# Patient Record
Sex: Male | Born: 1955 | Race: White | Hispanic: No | Marital: Married | Smoking: Never smoker
Health system: Southern US, Community
[De-identification: ages and names within clinical notes are randomized; demographics above are authoritative.]

## PROBLEM LIST (undated history)

## (undated) DIAGNOSIS — IMO0002 Reserved for concepts with insufficient information to code with codable children: Secondary | ICD-10-CM

## (undated) DIAGNOSIS — E7800 Pure hypercholesterolemia, unspecified: Secondary | ICD-10-CM

## (undated) HISTORY — PX: OTHER SURGICAL HISTORY: SHX169

## (undated) HISTORY — PX: EYE SURGERY: SHX253

## (undated) HISTORY — PX: FACIAL LACERATION REPAIR: SHX6589

## (undated) HISTORY — PX: COLONOSCOPY W/ BIOPSIES AND POLYPECTOMY: SHX1376

---

## 2007-03-02 ENCOUNTER — Emergency Department (HOSPITAL_COMMUNITY): Admission: EM | Admit: 2007-03-02 | Discharge: 2007-03-02 | Payer: Self-pay | Admitting: Emergency Medicine

## 2015-07-13 DIAGNOSIS — S299XXA Unspecified injury of thorax, initial encounter: Secondary | ICD-10-CM | POA: Diagnosis not present

## 2015-07-13 DIAGNOSIS — S8991XA Unspecified injury of right lower leg, initial encounter: Secondary | ICD-10-CM | POA: Diagnosis not present

## 2015-07-13 DIAGNOSIS — S098XXA Other specified injuries of head, initial encounter: Secondary | ICD-10-CM | POA: Diagnosis not present

## 2015-07-13 DIAGNOSIS — S0231XA Fracture of orbital floor, right side, initial encounter for closed fracture: Secondary | ICD-10-CM | POA: Diagnosis not present

## 2015-07-13 DIAGNOSIS — S0181XA Laceration without foreign body of other part of head, initial encounter: Secondary | ICD-10-CM | POA: Diagnosis not present

## 2015-07-13 DIAGNOSIS — Z23 Encounter for immunization: Secondary | ICD-10-CM | POA: Diagnosis not present

## 2015-07-13 DIAGNOSIS — S61411A Laceration without foreign body of right hand, initial encounter: Secondary | ICD-10-CM | POA: Diagnosis not present

## 2015-07-13 DIAGNOSIS — R220 Localized swelling, mass and lump, head: Secondary | ICD-10-CM | POA: Diagnosis not present

## 2015-07-13 DIAGNOSIS — S66324A Laceration of extensor muscle, fascia and tendon of right ring finger at wrist and hand level, initial encounter: Secondary | ICD-10-CM | POA: Diagnosis not present

## 2015-07-13 DIAGNOSIS — S199XXA Unspecified injury of neck, initial encounter: Secondary | ICD-10-CM | POA: Diagnosis not present

## 2015-07-13 DIAGNOSIS — M50322 Other cervical disc degeneration at C5-C6 level: Secondary | ICD-10-CM | POA: Diagnosis not present

## 2015-07-13 DIAGNOSIS — S66323A Laceration of extensor muscle, fascia and tendon of left middle finger at wrist and hand level, initial encounter: Secondary | ICD-10-CM | POA: Diagnosis not present

## 2015-07-13 DIAGNOSIS — S61412A Laceration without foreign body of left hand, initial encounter: Secondary | ICD-10-CM | POA: Diagnosis not present

## 2015-07-13 DIAGNOSIS — J320 Chronic maxillary sinusitis: Secondary | ICD-10-CM | POA: Diagnosis not present

## 2015-07-13 DIAGNOSIS — S060X9S Concussion with loss of consciousness of unspecified duration, sequela: Secondary | ICD-10-CM | POA: Diagnosis not present

## 2015-07-13 DIAGNOSIS — S060X9A Concussion with loss of consciousness of unspecified duration, initial encounter: Secondary | ICD-10-CM | POA: Diagnosis not present

## 2015-07-13 DIAGNOSIS — S01411A Laceration without foreign body of right cheek and temporomandibular area, initial encounter: Secondary | ICD-10-CM | POA: Diagnosis not present

## 2015-07-13 DIAGNOSIS — S81011A Laceration without foreign body, right knee, initial encounter: Secondary | ICD-10-CM | POA: Diagnosis not present

## 2015-07-14 DIAGNOSIS — S0231XA Fracture of orbital floor, right side, initial encounter for closed fracture: Secondary | ICD-10-CM | POA: Diagnosis not present

## 2015-07-14 DIAGNOSIS — S01411A Laceration without foreign body of right cheek and temporomandibular area, initial encounter: Secondary | ICD-10-CM | POA: Diagnosis not present

## 2015-07-14 DIAGNOSIS — S0181XA Laceration without foreign body of other part of head, initial encounter: Secondary | ICD-10-CM | POA: Diagnosis not present

## 2015-07-14 DIAGNOSIS — S01111A Laceration without foreign body of right eyelid and periocular area, initial encounter: Secondary | ICD-10-CM | POA: Diagnosis not present

## 2015-07-16 DIAGNOSIS — M79642 Pain in left hand: Secondary | ICD-10-CM | POA: Diagnosis not present

## 2015-07-16 DIAGNOSIS — S81011A Laceration without foreign body, right knee, initial encounter: Secondary | ICD-10-CM | POA: Diagnosis not present

## 2015-07-16 DIAGNOSIS — M79641 Pain in right hand: Secondary | ICD-10-CM | POA: Diagnosis not present

## 2015-07-17 DIAGNOSIS — M79642 Pain in left hand: Secondary | ICD-10-CM | POA: Diagnosis not present

## 2015-07-17 DIAGNOSIS — M79641 Pain in right hand: Secondary | ICD-10-CM | POA: Diagnosis not present

## 2015-07-21 DIAGNOSIS — S0230XD Fracture of orbital floor, unspecified side, subsequent encounter for fracture with routine healing: Secondary | ICD-10-CM | POA: Diagnosis not present

## 2015-07-21 DIAGNOSIS — S0181XD Laceration without foreign body of other part of head, subsequent encounter: Secondary | ICD-10-CM | POA: Diagnosis not present

## 2015-07-23 ENCOUNTER — Other Ambulatory Visit: Payer: Self-pay | Admitting: Orthopedic Surgery

## 2015-07-23 DIAGNOSIS — S66392D Other injury of extensor muscle, fascia and tendon of right middle finger at wrist and hand level, subsequent encounter: Secondary | ICD-10-CM | POA: Diagnosis not present

## 2015-07-23 DIAGNOSIS — S81011D Laceration without foreign body, right knee, subsequent encounter: Secondary | ICD-10-CM | POA: Diagnosis not present

## 2015-07-23 DIAGNOSIS — M79642 Pain in left hand: Secondary | ICD-10-CM | POA: Diagnosis not present

## 2015-07-23 DIAGNOSIS — M79641 Pain in right hand: Secondary | ICD-10-CM | POA: Diagnosis not present

## 2015-07-25 ENCOUNTER — Encounter (HOSPITAL_COMMUNITY): Payer: Self-pay | Admitting: *Deleted

## 2015-07-25 NOTE — Progress Notes (Signed)
Pt denies SOB, chest pain, and being under the care of a cardiologist. Pt denies having a stress test, echo and cardiac cath. Pt denies having an EKG within the last year. Pt made aware to stop taking Aspirin, vitamins, fish oil and herbal medications. Do not take any NSAIDs ie: Ibuprofen, Advil, Naproxen, BC and Goody powder or any medication containing Aspirin.  Pt verbalized understanding of all pre-op instructions

## 2015-07-26 ENCOUNTER — Encounter (HOSPITAL_COMMUNITY): Payer: Self-pay | Admitting: *Deleted

## 2015-07-26 ENCOUNTER — Ambulatory Visit (HOSPITAL_COMMUNITY): Payer: BLUE CROSS/BLUE SHIELD | Admitting: Certified Registered"

## 2015-07-26 ENCOUNTER — Ambulatory Visit (HOSPITAL_COMMUNITY)
Admission: RE | Admit: 2015-07-26 | Discharge: 2015-07-26 | Disposition: A | Discharge status: Home / Self Care | Payer: BLUE CROSS/BLUE SHIELD | Source: Ambulatory Visit | Attending: Orthopedic Surgery | Admitting: Orthopedic Surgery

## 2015-07-26 ENCOUNTER — Encounter (HOSPITAL_COMMUNITY)
Admission: RE | Disposition: A | Discharge status: Home / Self Care | Payer: Self-pay | Source: Ambulatory Visit | Attending: Orthopedic Surgery

## 2015-07-26 DIAGNOSIS — S66811A Strain of other specified muscles, fascia and tendons at wrist and hand level, right hand, initial encounter: Secondary | ICD-10-CM | POA: Diagnosis not present

## 2015-07-26 DIAGNOSIS — S66309A Unspecified injury of extensor muscle, fascia and tendon of unspecified finger at wrist and hand level, initial encounter: Secondary | ICD-10-CM | POA: Diagnosis not present

## 2015-07-26 DIAGNOSIS — S66312A Strain of extensor muscle, fascia and tendon of right middle finger at wrist and hand level, initial encounter: Secondary | ICD-10-CM | POA: Diagnosis not present

## 2015-07-26 DIAGNOSIS — S66392A Other injury of extensor muscle, fascia and tendon of right middle finger at wrist and hand level, initial encounter: Secondary | ICD-10-CM | POA: Diagnosis not present

## 2015-07-26 DIAGNOSIS — S6981XA Other specified injuries of right wrist, hand and finger(s), initial encounter: Secondary | ICD-10-CM | POA: Insufficient documentation

## 2015-07-26 DIAGNOSIS — Y9355 Activity, bike riding: Secondary | ICD-10-CM | POA: Diagnosis not present

## 2015-07-26 DIAGNOSIS — S60412A Abrasion of right middle finger, initial encounter: Secondary | ICD-10-CM | POA: Diagnosis not present

## 2015-07-26 DIAGNOSIS — E7800 Pure hypercholesterolemia, unspecified: Secondary | ICD-10-CM | POA: Insufficient documentation

## 2015-07-26 DIAGNOSIS — S6991XA Unspecified injury of right wrist, hand and finger(s), initial encounter: Secondary | ICD-10-CM | POA: Diagnosis not present

## 2015-07-26 DIAGNOSIS — T148 Other injury of unspecified body region: Secondary | ICD-10-CM | POA: Diagnosis not present

## 2015-07-26 HISTORY — DX: Reserved for concepts with insufficient information to code with codable children: IMO0002

## 2015-07-26 HISTORY — DX: Pure hypercholesterolemia, unspecified: E78.00

## 2015-07-26 HISTORY — PX: I & D EXTREMITY: SHX5045

## 2015-07-26 SURGERY — IRRIGATION AND DEBRIDEMENT EXTREMITY
Anesthesia: General | Site: Hand | Laterality: Right

## 2015-07-26 MED ORDER — CEFAZOLIN SODIUM-DEXTROSE 2-4 GM/100ML-% IV SOLN
2.0000 g | INTRAVENOUS | Status: AC
  Administered 2015-07-26: 2 g via INTRAVENOUS

## 2015-07-26 MED ORDER — ONDANSETRON HCL 4 MG/2ML IJ SOLN
INTRAMUSCULAR | Status: DC | PRN
  Administered 2015-07-26: 4 mg via INTRAVENOUS

## 2015-07-26 MED ORDER — FENTANYL CITRATE (PF) 250 MCG/5ML IJ SOLN
INTRAMUSCULAR | Status: DC | PRN
  Administered 2015-07-26: 100 ug via INTRAVENOUS
  Administered 2015-07-26 (×2): 25 ug via INTRAVENOUS
  Administered 2015-07-26: 50 ug via INTRAVENOUS

## 2015-07-26 MED ORDER — BUPIVACAINE HCL (PF) 0.25 % IJ SOLN
INTRAMUSCULAR | Status: DC | PRN
  Administered 2015-07-26: 8 mL

## 2015-07-26 MED ORDER — HYDROMORPHONE HCL 1 MG/ML IJ SOLN
INTRAMUSCULAR | Status: AC
  Administered 2015-07-26: 0.5 mg via INTRAVENOUS
  Filled 2015-07-26: qty 1

## 2015-07-26 MED ORDER — LIDOCAINE HCL (CARDIAC) 20 MG/ML IV SOLN
INTRAVENOUS | Status: DC | PRN
  Administered 2015-07-26: 80 mg via INTRAVENOUS

## 2015-07-26 MED ORDER — BUPIVACAINE HCL (PF) 0.25 % IJ SOLN
INTRAMUSCULAR | Status: AC
  Filled 2015-07-26: qty 30

## 2015-07-26 MED ORDER — CEFAZOLIN SODIUM-DEXTROSE 2-4 GM/100ML-% IV SOLN
INTRAVENOUS | Status: AC
  Filled 2015-07-26: qty 100

## 2015-07-26 MED ORDER — SULFAMETHOXAZOLE-TRIMETHOPRIM 400-80 MG PO TABS: 1.0000 | ORAL_TABLET | Freq: Two times a day (BID) | ORAL | Status: AC

## 2015-07-26 MED ORDER — FENTANYL CITRATE (PF) 250 MCG/5ML IJ SOLN
INTRAMUSCULAR | Status: AC
  Filled 2015-07-26: qty 5

## 2015-07-26 MED ORDER — HYDROMORPHONE HCL 1 MG/ML IJ SOLN
0.2500 mg | INTRAMUSCULAR | Status: DC | PRN
  Administered 2015-07-26 (×2): 0.5 mg via INTRAVENOUS

## 2015-07-26 MED ORDER — LACTATED RINGERS IV SOLN
INTRAVENOUS | Status: DC
  Administered 2015-07-26 (×2): via INTRAVENOUS

## 2015-07-26 MED ORDER — PROPOFOL 10 MG/ML IV BOLUS
INTRAVENOUS | Status: AC
  Filled 2015-07-26: qty 20

## 2015-07-26 MED ORDER — SODIUM CHLORIDE 0.9 % IR SOLN
Status: DC | PRN
  Administered 2015-07-26: 1000 mL

## 2015-07-26 MED ORDER — MIDAZOLAM HCL 2 MG/2ML IJ SOLN
INTRAMUSCULAR | Status: AC
  Filled 2015-07-26: qty 2

## 2015-07-26 MED ORDER — BACITRACIN-NEOMYCIN-POLYMYXIN 400-5-5000 EX OINT
TOPICAL_OINTMENT | CUTANEOUS | Status: AC
Start: 2015-07-26 — End: 2015-07-26
  Filled 2015-07-26: qty 1

## 2015-07-26 MED ORDER — CHLORHEXIDINE GLUCONATE 4 % EX LIQD: 60.0000 mL | Freq: Once | CUTANEOUS | Status: DC

## 2015-07-26 MED ORDER — SODIUM CHLORIDE 0.9 % IR SOLN
Status: DC | PRN
  Administered 2015-07-26 (×2): 3000 mL

## 2015-07-26 MED ORDER — OXYCODONE HCL 5 MG PO TABS: 10.0000 mg | ORAL_TABLET | ORAL | Status: AC | PRN

## 2015-07-26 MED ORDER — ONDANSETRON HCL 4 MG/2ML IJ SOLN
INTRAMUSCULAR | Status: AC
  Filled 2015-07-26: qty 2

## 2015-07-26 MED ORDER — MIDAZOLAM HCL 5 MG/5ML IJ SOLN
INTRAMUSCULAR | Status: DC | PRN
  Administered 2015-07-26: 2 mg via INTRAVENOUS

## 2015-07-26 MED ORDER — PROPOFOL 10 MG/ML IV BOLUS
INTRAVENOUS | Status: DC | PRN
  Administered 2015-07-26: 160 mg via INTRAVENOUS

## 2015-07-26 SURGICAL SUPPLY — 54 items
BANDAGE ACE 3X5.8 VEL STRL LF (GAUZE/BANDAGES/DRESSINGS) ×1 IMPLANT
BANDAGE ELASTIC 4 VELCRO ST LF (GAUZE/BANDAGES/DRESSINGS) ×2 IMPLANT
BNDG CMPR 9X4 STRL LF SNTH (GAUZE/BANDAGES/DRESSINGS) ×1
BNDG CONFORM 2 STRL LF (GAUZE/BANDAGES/DRESSINGS) IMPLANT
BNDG ESMARK 4X9 LF (GAUZE/BANDAGES/DRESSINGS) ×1 IMPLANT
BNDG GAUZE ELAST 4 BULKY (GAUZE/BANDAGES/DRESSINGS) ×4 IMPLANT
CORDS BIPOLAR (ELECTRODE) ×2 IMPLANT
CUFF TOURNIQUET SINGLE 18IN (TOURNIQUET CUFF) ×2 IMPLANT
CUFF TOURNIQUET SINGLE 24IN (TOURNIQUET CUFF) IMPLANT
DRAPE SURG 17X23 STRL (DRAPES) ×1 IMPLANT
DRSG ADAPTIC 3X8 NADH LF (GAUZE/BANDAGES/DRESSINGS) ×1 IMPLANT
DRSG EMULSION OIL 3X3 NADH (GAUZE/BANDAGES/DRESSINGS) ×1 IMPLANT
DRSG MEPITEL 4X7.2 (GAUZE/BANDAGES/DRESSINGS) ×1 IMPLANT
GAUZE SPONGE 4X4 12PLY STRL (GAUZE/BANDAGES/DRESSINGS) ×2 IMPLANT
GAUZE XEROFORM 1X8 LF (GAUZE/BANDAGES/DRESSINGS) ×2 IMPLANT
GLOVE BIOGEL M 8.0 STRL (GLOVE) ×2 IMPLANT
GLOVE SS BIOGEL STRL SZ 8 (GLOVE) ×1 IMPLANT
GLOVE SUPERSENSE BIOGEL SZ 8 (GLOVE) ×1
GOWN STRL REUS W/ TWL LRG LVL3 (GOWN DISPOSABLE) ×1 IMPLANT
GOWN STRL REUS W/ TWL XL LVL3 (GOWN DISPOSABLE) ×2 IMPLANT
GOWN STRL REUS W/TWL LRG LVL3 (GOWN DISPOSABLE) ×2
GOWN STRL REUS W/TWL XL LVL3 (GOWN DISPOSABLE) ×4
HANDPIECE INTERPULSE COAX TIP (DISPOSABLE)
KIT BASIN OR (CUSTOM PROCEDURE TRAY) ×2 IMPLANT
KIT ROOM TURNOVER OR (KITS) ×2 IMPLANT
MANIFOLD NEPTUNE II (INSTRUMENTS) ×2 IMPLANT
NDL HYPO 25GX1X1/2 BEV (NEEDLE) IMPLANT
NEEDLE HYPO 25GX1X1/2 BEV (NEEDLE) ×2 IMPLANT
NS IRRIG 1000ML POUR BTL (IV SOLUTION) ×2 IMPLANT
PACK ORTHO EXTREMITY (CUSTOM PROCEDURE TRAY) ×2 IMPLANT
PAD ARMBOARD 7.5X6 YLW CONV (MISCELLANEOUS) ×3 IMPLANT
PAD CAST 4YDX4 CTTN HI CHSV (CAST SUPPLIES) ×1 IMPLANT
PADDING CAST ABS 3INX4YD NS (CAST SUPPLIES) ×1
PADDING CAST ABS 4INX4YD NS (CAST SUPPLIES) ×1
PADDING CAST ABS COTTON 3X4 (CAST SUPPLIES) IMPLANT
PADDING CAST ABS COTTON 4X4 ST (CAST SUPPLIES) IMPLANT
PADDING CAST COTTON 4X4 STRL (CAST SUPPLIES)
SCOTCHCAST PLUS 2X4 WHITE (CAST SUPPLIES) ×1 IMPLANT
SET CYSTO W/LG BORE CLAMP LF (SET/KITS/TRAYS/PACK) ×1 IMPLANT
SET HNDPC FAN SPRY TIP SCT (DISPOSABLE) IMPLANT
SPONGE LAP 4X18 X RAY DECT (DISPOSABLE) ×1 IMPLANT
SUT CHROMIC 5 0 P 3 (SUTURE) ×1 IMPLANT
SUT FIBERWIRE 3-0 18 TAPR NDL (SUTURE) ×6
SUT FIBERWIRE 4-0 18 TAPR NDL (SUTURE) ×2
SUT PROLENE 4 0 PS 2 18 (SUTURE) ×1 IMPLANT
SUTURE FIBERWR 3-0 18 TAPR NDL (SUTURE) IMPLANT
SUTURE FIBERWR 4-0 18 TAPR NDL (SUTURE) IMPLANT
SYR CONTROL 10ML LL (SYRINGE) ×1 IMPLANT
TOWEL OR 17X24 6PK STRL BLUE (TOWEL DISPOSABLE) ×2 IMPLANT
TOWEL OR 17X26 10 PK STRL BLUE (TOWEL DISPOSABLE) ×2 IMPLANT
TUBE ANAEROBIC SPECIMEN COL (MISCELLANEOUS) IMPLANT
TUBE CONNECTING 12X1/4 (SUCTIONS) ×2 IMPLANT
WATER STERILE IRR 1000ML POUR (IV SOLUTION) ×2 IMPLANT
YANKAUER SUCT BULB TIP NO VENT (SUCTIONS) ×2 IMPLANT

## 2015-07-26 NOTE — Transfer of Care (Signed)
Immediate Anesthesia Transfer of Care Note  Patient: Raymond Downs  Procedure(s) Performed: Procedure(s): IRRIGATION AND DEBRIDEMENT REPAIR  RIGHT MIDDLE FINGER EXTENSOR WITH GRAFT IF NECESSARY (Right)  Patient Location: PACU  Anesthesia Type:General  Level of Consciousness: lethargic and responds to stimulation  Airway & Oxygen Therapy: Patient Spontanous Breathing and Patient connected to nasal cannula oxygen  Post-op Assessment: Report given to RN  Post vital signs: Reviewed and stable  Last Vitals:  Filed Vitals:   07/26/15 0959  BP: 111/62  Pulse: 64  Temp: 37.3 C  Resp: 16    Complications: No apparent anesthesia complications

## 2015-07-26 NOTE — Op Note (Signed)
Please see dictation number [id number] Angelicia Lessner M.D.

## 2015-07-26 NOTE — Discharge Instructions (Signed)
Keep your cast on clean and dry.  We will remove the cast when we see you in follow-up.  Please continue the bandage changes for your knee as directed  Keep bandage clean and dry.  Call for any problems.  No smoking.  Criteria for driving a car: you should be off your pain medicine for 7-8 hours, able to drive one handed(confident), thinking clearly and feeling able in your judgement to drive. Continue elevation as it will decrease swelling.  If instructed by MD move your fingers within the confines of the bandage/splint.  Use ice if instructed by your MD. Call immediately for any sudden loss of feeling in your hand/arm or change in functional abilities of the extremity.We recommend that you to take vitamin C 1000 mg a day to promote healing. We also recommend that if you require  pain medicine that you take a stool softener to prevent constipation as most pain medicines will have constipation side effects. We recommend either Peri-Colace or Senokot and recommend that you also consider adding MiraLAX as well to prevent the constipation affects from pain medicine if you are required to use them. These medicines are over the counter and may be purchased at a local pharmacy. A cup of yogurt and a probiotic can also be helpful during the recovery process as the medicines can disrupt your intestinal environment.

## 2015-07-26 NOTE — Anesthesia Procedure Notes (Signed)
Procedure Name: LMA Insertion Date/Time: 07/26/2015 12:39 PM Performed by: Jefm MilesENNIE, Kale Rondeau E Pre-anesthesia Checklist: Patient identified, Emergency Drugs available, Suction available, Patient being monitored and Timeout performed Patient Re-evaluated:Patient Re-evaluated prior to inductionOxygen Delivery Method: Circle system utilized Preoxygenation: Pre-oxygenation with 100% oxygen Intubation Type: IV induction Ventilation: Mask ventilation without difficulty LMA: LMA inserted LMA Size: 4.0 Number of attempts: 2 Placement Confirmation: positive ETCO2 and breath sounds checked- equal and bilateral Tube secured with: Tape Dental Injury: Teeth and Oropharynx as per pre-operative assessment  Comments: 1st attempt with LMA 5, unable to ventilate, 2nd attempt with LMA 4 obtained good tidal volumes and ETCO2

## 2015-07-26 NOTE — Anesthesia Preprocedure Evaluation (Addendum)
Anesthesia Evaluation  Patient identified by MRN, date of birth, ID band Patient awake    Airway Mallampati: I  TM Distance: >3 FB Neck ROM: Full    Dental  (+) Teeth Intact   Pulmonary neg pulmonary ROS,    breath sounds clear to auscultation       Cardiovascular negative cardio ROS   Rhythm:Regular Rate:Normal     Neuro/Psych negative neurological ROS     GI/Hepatic negative GI ROS, Neg liver ROS,   Endo/Other  negative endocrine ROS  Renal/GU negative Renal ROS     Musculoskeletal negative musculoskeletal ROS (+)   Abdominal   Peds  Hematology   Anesthesia Other Findings   Reproductive/Obstetrics                            Anesthesia Physical Anesthesia Plan  ASA: I  Anesthesia Plan: General   Post-op Pain Management:    Induction: Intravenous  Airway Management Planned: LMA  Additional Equipment:   Intra-op Plan:   Post-operative Plan: Extubation in OR  Informed Consent: I have reviewed the patients History and Physical, chart, labs and discussed the procedure including the risks, benefits and alternatives for the proposed anesthesia with the patient or authorized representative who has indicated his/her understanding and acceptance.   Dental advisory given  Plan Discussed with: CRNA and Surgeon  Anesthesia Plan Comments:         Anesthesia Quick Evaluation
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
## 2015-07-26 NOTE — Anesthesia Postprocedure Evaluation (Signed)
Anesthesia Post Note  Patient: Raymond Downs  Procedure(s) Performed: Procedure(s) (LRB): IRRIGATION AND DEBRIDEMENT REPAIR  RIGHT MIDDLE FINGER EXTENSOR WITH GRAFT IF NECESSARY (Right)  Patient location during evaluation: PACU Anesthesia Type: General Level of consciousness: awake and alert Pain management: satisfactory to patient Vital Signs Assessment: post-procedure vital signs reviewed and stable Respiratory status: spontaneous breathing, nonlabored ventilation, respiratory function stable and patient connected to nasal cannula oxygen Cardiovascular status: blood pressure returned to baseline and stable Postop Assessment: no signs of nausea or vomiting Anesthetic complications: no    Last Vitals:  Filed Vitals:   07/26/15 1449 07/26/15 1504  BP: 122/76 118/78  Pulse: 64 68  Temp:    Resp: 7 10    Last Pain: There were no vitals filed for this visit.               Urban Naval,JAMES TERRILL

## 2015-07-26 NOTE — OR Nursing (Signed)
Right knee cleaned by Dr. Dominica SeverinWilliam Gramig with ch

## 2015-07-26 NOTE — OR Nursing (Signed)
Right knee cleaned with chlorhexidine and sterile 4x4 gauze sponges and triple antibiotic applied to area on knee then knee was wrapped with a sterile 6 inch ace bandage by Dr. Dominica SeverinWilliam Gramig.

## 2015-07-28 ENCOUNTER — Encounter (HOSPITAL_COMMUNITY): Payer: Self-pay | Admitting: Orthopedic Surgery

## 2015-07-30 DIAGNOSIS — S0231XA Fracture of orbital floor, right side, initial encounter for closed fracture: Secondary | ICD-10-CM | POA: Diagnosis not present

## 2015-07-30 DIAGNOSIS — H524 Presbyopia: Secondary | ICD-10-CM | POA: Diagnosis not present

## 2015-07-30 DIAGNOSIS — H5203 Hypermetropia, bilateral: Secondary | ICD-10-CM | POA: Diagnosis not present

## 2015-08-01 DIAGNOSIS — S66392D Other injury of extensor muscle, fascia and tendon of right middle finger at wrist and hand level, subsequent encounter: Secondary | ICD-10-CM | POA: Diagnosis not present

## 2015-08-01 DIAGNOSIS — Z4789 Encounter for other orthopedic aftercare: Secondary | ICD-10-CM | POA: Diagnosis not present

## 2015-08-04 DIAGNOSIS — S0181XD Laceration without foreign body of other part of head, subsequent encounter: Secondary | ICD-10-CM | POA: Diagnosis not present

## 2015-08-04 DIAGNOSIS — S0231XD Fracture of orbital floor, right side, subsequent encounter for fracture with routine healing: Secondary | ICD-10-CM | POA: Diagnosis not present

## 2015-08-11 DIAGNOSIS — S66392D Other injury of extensor muscle, fascia and tendon of right middle finger at wrist and hand level, subsequent encounter: Secondary | ICD-10-CM | POA: Diagnosis not present

## 2015-08-11 DIAGNOSIS — M79641 Pain in right hand: Secondary | ICD-10-CM | POA: Diagnosis not present

## 2015-08-15 DIAGNOSIS — S66392D Other injury of extensor muscle, fascia and tendon of right middle finger at wrist and hand level, subsequent encounter: Secondary | ICD-10-CM | POA: Diagnosis not present

## 2015-08-18 DIAGNOSIS — S66392D Other injury of extensor muscle, fascia and tendon of right middle finger at wrist and hand level, subsequent encounter: Secondary | ICD-10-CM | POA: Diagnosis not present

## 2015-08-20 DIAGNOSIS — S66392D Other injury of extensor muscle, fascia and tendon of right middle finger at wrist and hand level, subsequent encounter: Secondary | ICD-10-CM | POA: Diagnosis not present

## 2015-08-26 DIAGNOSIS — S66392D Other injury of extensor muscle, fascia and tendon of right middle finger at wrist and hand level, subsequent encounter: Secondary | ICD-10-CM | POA: Diagnosis not present

## 2015-08-28 DIAGNOSIS — S66392D Other injury of extensor muscle, fascia and tendon of right middle finger at wrist and hand level, subsequent encounter: Secondary | ICD-10-CM | POA: Diagnosis not present

## 2015-09-03 DIAGNOSIS — S66392D Other injury of extensor muscle, fascia and tendon of right middle finger at wrist and hand level, subsequent encounter: Secondary | ICD-10-CM | POA: Diagnosis not present

## 2015-09-09 DIAGNOSIS — S66392D Other injury of extensor muscle, fascia and tendon of right middle finger at wrist and hand level, subsequent encounter: Secondary | ICD-10-CM | POA: Diagnosis not present

## 2015-09-16 DIAGNOSIS — S66392D Other injury of extensor muscle, fascia and tendon of right middle finger at wrist and hand level, subsequent encounter: Secondary | ICD-10-CM | POA: Diagnosis not present

## 2015-09-18 DIAGNOSIS — S66392D Other injury of extensor muscle, fascia and tendon of right middle finger at wrist and hand level, subsequent encounter: Secondary | ICD-10-CM | POA: Diagnosis not present

## 2015-09-22 DIAGNOSIS — S66392D Other injury of extensor muscle, fascia and tendon of right middle finger at wrist and hand level, subsequent encounter: Secondary | ICD-10-CM | POA: Diagnosis not present

## 2015-09-24 DIAGNOSIS — S66392D Other injury of extensor muscle, fascia and tendon of right middle finger at wrist and hand level, subsequent encounter: Secondary | ICD-10-CM | POA: Diagnosis not present

## 2015-09-29 DIAGNOSIS — S0231XD Fracture of orbital floor, right side, subsequent encounter for fracture with routine healing: Secondary | ICD-10-CM | POA: Diagnosis not present

## 2015-09-29 DIAGNOSIS — S0181XD Laceration without foreign body of other part of head, subsequent encounter: Secondary | ICD-10-CM | POA: Diagnosis not present

## 2015-10-08 DIAGNOSIS — S66392D Other injury of extensor muscle, fascia and tendon of right middle finger at wrist and hand level, subsequent encounter: Secondary | ICD-10-CM | POA: Diagnosis not present

## 2015-10-13 DIAGNOSIS — S66392D Other injury of extensor muscle, fascia and tendon of right middle finger at wrist and hand level, subsequent encounter: Secondary | ICD-10-CM | POA: Diagnosis not present

## 2015-10-13 DIAGNOSIS — M25641 Stiffness of right hand, not elsewhere classified: Secondary | ICD-10-CM | POA: Diagnosis not present

## 2015-10-23 DIAGNOSIS — S66392D Other injury of extensor muscle, fascia and tendon of right middle finger at wrist and hand level, subsequent encounter: Secondary | ICD-10-CM | POA: Diagnosis not present

## 2015-10-23 DIAGNOSIS — M25641 Stiffness of right hand, not elsewhere classified: Secondary | ICD-10-CM | POA: Diagnosis not present

## 2015-11-05 DIAGNOSIS — Z4789 Encounter for other orthopedic aftercare: Secondary | ICD-10-CM | POA: Diagnosis not present

## 2015-12-22 DIAGNOSIS — L814 Other melanin hyperpigmentation: Secondary | ICD-10-CM | POA: Diagnosis not present

## 2015-12-22 DIAGNOSIS — D1801 Hemangioma of skin and subcutaneous tissue: Secondary | ICD-10-CM | POA: Diagnosis not present

## 2015-12-22 DIAGNOSIS — L821 Other seborrheic keratosis: Secondary | ICD-10-CM | POA: Diagnosis not present

## 2015-12-22 DIAGNOSIS — D225 Melanocytic nevi of trunk: Secondary | ICD-10-CM | POA: Diagnosis not present

## 2016-06-17 DIAGNOSIS — H2513 Age-related nuclear cataract, bilateral: Secondary | ICD-10-CM | POA: Diagnosis not present

## 2016-06-17 DIAGNOSIS — H5203 Hypermetropia, bilateral: Secondary | ICD-10-CM | POA: Diagnosis not present

## 2016-06-24 DIAGNOSIS — Z125 Encounter for screening for malignant neoplasm of prostate: Secondary | ICD-10-CM | POA: Diagnosis not present

## 2016-06-24 DIAGNOSIS — Z0000 Encounter for general adult medical examination without abnormal findings: Secondary | ICD-10-CM | POA: Diagnosis not present

## 2016-06-24 DIAGNOSIS — E7800 Pure hypercholesterolemia, unspecified: Secondary | ICD-10-CM | POA: Diagnosis not present

## 2016-08-17 DIAGNOSIS — L720 Epidermal cyst: Secondary | ICD-10-CM | POA: Diagnosis not present

## 2016-08-17 DIAGNOSIS — L820 Inflamed seborrheic keratosis: Secondary | ICD-10-CM | POA: Diagnosis not present

## 2016-08-17 DIAGNOSIS — L821 Other seborrheic keratosis: Secondary | ICD-10-CM | POA: Diagnosis not present

## 2016-08-17 DIAGNOSIS — D225 Melanocytic nevi of trunk: Secondary | ICD-10-CM | POA: Diagnosis not present

## 2017-02-02 DIAGNOSIS — Z23 Encounter for immunization: Secondary | ICD-10-CM | POA: Diagnosis not present

## 2017-04-20 DIAGNOSIS — L814 Other melanin hyperpigmentation: Secondary | ICD-10-CM | POA: Diagnosis not present

## 2017-04-20 DIAGNOSIS — D1801 Hemangioma of skin and subcutaneous tissue: Secondary | ICD-10-CM | POA: Diagnosis not present

## 2017-04-20 DIAGNOSIS — D225 Melanocytic nevi of trunk: Secondary | ICD-10-CM | POA: Diagnosis not present

## 2017-04-20 DIAGNOSIS — L821 Other seborrheic keratosis: Secondary | ICD-10-CM | POA: Diagnosis not present

## 2017-09-28 DIAGNOSIS — K5730 Diverticulosis of large intestine without perforation or abscess without bleeding: Secondary | ICD-10-CM | POA: Diagnosis not present

## 2017-09-28 DIAGNOSIS — Z86010 Personal history of colonic polyps: Secondary | ICD-10-CM | POA: Diagnosis not present

## 2017-12-13 DIAGNOSIS — Z23 Encounter for immunization: Secondary | ICD-10-CM | POA: Diagnosis not present

## 2018-01-16 DIAGNOSIS — Z125 Encounter for screening for malignant neoplasm of prostate: Secondary | ICD-10-CM | POA: Diagnosis not present

## 2018-01-16 DIAGNOSIS — Z0000 Encounter for general adult medical examination without abnormal findings: Secondary | ICD-10-CM | POA: Diagnosis not present

## 2018-01-16 DIAGNOSIS — E7800 Pure hypercholesterolemia, unspecified: Secondary | ICD-10-CM | POA: Diagnosis not present

## 2018-01-16 DIAGNOSIS — Z23 Encounter for immunization: Secondary | ICD-10-CM | POA: Diagnosis not present

## 2018-12-04 DIAGNOSIS — H5010 Unspecified exotropia: Secondary | ICD-10-CM | POA: Diagnosis not present

## 2018-12-04 DIAGNOSIS — H2513 Age-related nuclear cataract, bilateral: Secondary | ICD-10-CM | POA: Diagnosis not present

## 2018-12-04 DIAGNOSIS — H524 Presbyopia: Secondary | ICD-10-CM | POA: Diagnosis not present

## 2019-01-02 DIAGNOSIS — Z23 Encounter for immunization: Secondary | ICD-10-CM | POA: Diagnosis not present

## 2019-01-31 DIAGNOSIS — Z125 Encounter for screening for malignant neoplasm of prostate: Secondary | ICD-10-CM | POA: Diagnosis not present

## 2019-01-31 DIAGNOSIS — Z0000 Encounter for general adult medical examination without abnormal findings: Secondary | ICD-10-CM | POA: Diagnosis not present

## 2019-01-31 DIAGNOSIS — E7800 Pure hypercholesterolemia, unspecified: Secondary | ICD-10-CM | POA: Diagnosis not present

## 2019-11-02 DIAGNOSIS — B349 Viral infection, unspecified: Secondary | ICD-10-CM | POA: Diagnosis not present

## 2019-11-02 DIAGNOSIS — Z6826 Body mass index (BMI) 26.0-26.9, adult: Secondary | ICD-10-CM | POA: Diagnosis not present

## 2019-11-02 DIAGNOSIS — Z20822 Contact with and (suspected) exposure to covid-19: Secondary | ICD-10-CM | POA: Diagnosis not present

## 2019-11-02 DIAGNOSIS — J029 Acute pharyngitis, unspecified: Secondary | ICD-10-CM | POA: Diagnosis not present

## 2019-11-03 DIAGNOSIS — Z20822 Contact with and (suspected) exposure to covid-19: Secondary | ICD-10-CM | POA: Diagnosis not present

## 2020-01-31 DIAGNOSIS — Z23 Encounter for immunization: Secondary | ICD-10-CM | POA: Diagnosis not present

## 2020-02-07 DIAGNOSIS — Z8619 Personal history of other infectious and parasitic diseases: Secondary | ICD-10-CM | POA: Diagnosis not present

## 2020-02-07 DIAGNOSIS — Z125 Encounter for screening for malignant neoplasm of prostate: Secondary | ICD-10-CM | POA: Diagnosis not present

## 2020-02-07 DIAGNOSIS — Z0000 Encounter for general adult medical examination without abnormal findings: Secondary | ICD-10-CM | POA: Diagnosis not present

## 2020-02-07 DIAGNOSIS — Z86010 Personal history of colonic polyps: Secondary | ICD-10-CM | POA: Diagnosis not present

## 2020-02-07 DIAGNOSIS — E7800 Pure hypercholesterolemia, unspecified: Secondary | ICD-10-CM | POA: Diagnosis not present

## 2021-01-12 DIAGNOSIS — Z23 Encounter for immunization: Secondary | ICD-10-CM | POA: Diagnosis not present

## 2021-01-27 DIAGNOSIS — H5203 Hypermetropia, bilateral: Secondary | ICD-10-CM | POA: Diagnosis not present

## 2021-01-27 DIAGNOSIS — H2513 Age-related nuclear cataract, bilateral: Secondary | ICD-10-CM | POA: Diagnosis not present

## 2021-03-11 DIAGNOSIS — Z23 Encounter for immunization: Secondary | ICD-10-CM | POA: Diagnosis not present

## 2021-03-11 DIAGNOSIS — E7800 Pure hypercholesterolemia, unspecified: Secondary | ICD-10-CM | POA: Diagnosis not present

## 2021-03-11 DIAGNOSIS — R6882 Decreased libido: Secondary | ICD-10-CM | POA: Diagnosis not present

## 2021-03-11 DIAGNOSIS — Z0000 Encounter for general adult medical examination without abnormal findings: Secondary | ICD-10-CM | POA: Diagnosis not present

## 2021-03-11 DIAGNOSIS — Z125 Encounter for screening for malignant neoplasm of prostate: Secondary | ICD-10-CM | POA: Diagnosis not present

## 2021-03-19 DIAGNOSIS — H1132 Conjunctival hemorrhage, left eye: Secondary | ICD-10-CM | POA: Diagnosis not present

## 2021-05-27 DIAGNOSIS — D492 Neoplasm of unspecified behavior of bone, soft tissue, and skin: Secondary | ICD-10-CM | POA: Diagnosis not present

## 2021-05-27 DIAGNOSIS — L821 Other seborrheic keratosis: Secondary | ICD-10-CM | POA: Diagnosis not present

## 2021-05-27 DIAGNOSIS — D225 Melanocytic nevi of trunk: Secondary | ICD-10-CM | POA: Diagnosis not present

## 2021-05-27 DIAGNOSIS — L819 Disorder of pigmentation, unspecified: Secondary | ICD-10-CM | POA: Diagnosis not present

## 2021-05-27 DIAGNOSIS — L570 Actinic keratosis: Secondary | ICD-10-CM | POA: Diagnosis not present

## 2021-05-27 DIAGNOSIS — L814 Other melanin hyperpigmentation: Secondary | ICD-10-CM | POA: Diagnosis not present

## 2021-06-30 DIAGNOSIS — M25562 Pain in left knee: Secondary | ICD-10-CM | POA: Diagnosis not present

## 2021-07-01 ENCOUNTER — Ambulatory Visit
Admission: RE | Admit: 2021-07-01 | Discharge: 2021-07-01 | Disposition: A | Discharge status: Home / Self Care | Payer: BC Managed Care – PPO | Source: Ambulatory Visit | Attending: Sports Medicine | Admitting: Sports Medicine

## 2021-07-01 ENCOUNTER — Other Ambulatory Visit: Payer: Self-pay | Admitting: Sports Medicine

## 2021-07-01 DIAGNOSIS — M25562 Pain in left knee: Secondary | ICD-10-CM | POA: Diagnosis not present

## 2021-07-07 DIAGNOSIS — M25662 Stiffness of left knee, not elsewhere classified: Secondary | ICD-10-CM | POA: Diagnosis not present

## 2021-07-07 DIAGNOSIS — M25562 Pain in left knee: Secondary | ICD-10-CM | POA: Diagnosis not present

## 2021-07-07 DIAGNOSIS — M79662 Pain in left lower leg: Secondary | ICD-10-CM | POA: Diagnosis not present

## 2021-07-07 DIAGNOSIS — R2689 Other abnormalities of gait and mobility: Secondary | ICD-10-CM | POA: Diagnosis not present

## 2021-07-15 DIAGNOSIS — M25662 Stiffness of left knee, not elsewhere classified: Secondary | ICD-10-CM | POA: Diagnosis not present

## 2021-07-15 DIAGNOSIS — R2689 Other abnormalities of gait and mobility: Secondary | ICD-10-CM | POA: Diagnosis not present

## 2021-07-15 DIAGNOSIS — M25562 Pain in left knee: Secondary | ICD-10-CM | POA: Diagnosis not present

## 2021-07-15 DIAGNOSIS — M79662 Pain in left lower leg: Secondary | ICD-10-CM | POA: Diagnosis not present

## 2021-07-17 DIAGNOSIS — R2689 Other abnormalities of gait and mobility: Secondary | ICD-10-CM | POA: Diagnosis not present

## 2021-07-17 DIAGNOSIS — M25562 Pain in left knee: Secondary | ICD-10-CM | POA: Diagnosis not present

## 2021-07-17 DIAGNOSIS — M79662 Pain in left lower leg: Secondary | ICD-10-CM | POA: Diagnosis not present

## 2021-07-17 DIAGNOSIS — M25662 Stiffness of left knee, not elsewhere classified: Secondary | ICD-10-CM | POA: Diagnosis not present

## 2021-07-20 DIAGNOSIS — M25562 Pain in left knee: Secondary | ICD-10-CM | POA: Diagnosis not present

## 2021-07-20 DIAGNOSIS — M25662 Stiffness of left knee, not elsewhere classified: Secondary | ICD-10-CM | POA: Diagnosis not present

## 2021-07-20 DIAGNOSIS — R2689 Other abnormalities of gait and mobility: Secondary | ICD-10-CM | POA: Diagnosis not present

## 2021-07-20 DIAGNOSIS — M79662 Pain in left lower leg: Secondary | ICD-10-CM | POA: Diagnosis not present

## 2021-07-22 DIAGNOSIS — M79662 Pain in left lower leg: Secondary | ICD-10-CM | POA: Diagnosis not present

## 2021-07-22 DIAGNOSIS — R2689 Other abnormalities of gait and mobility: Secondary | ICD-10-CM | POA: Diagnosis not present

## 2021-07-22 DIAGNOSIS — M25562 Pain in left knee: Secondary | ICD-10-CM | POA: Diagnosis not present

## 2021-07-22 DIAGNOSIS — M25662 Stiffness of left knee, not elsewhere classified: Secondary | ICD-10-CM | POA: Diagnosis not present

## 2021-07-24 DIAGNOSIS — M79662 Pain in left lower leg: Secondary | ICD-10-CM | POA: Diagnosis not present

## 2021-07-24 DIAGNOSIS — M25662 Stiffness of left knee, not elsewhere classified: Secondary | ICD-10-CM | POA: Diagnosis not present

## 2021-07-24 DIAGNOSIS — M25562 Pain in left knee: Secondary | ICD-10-CM | POA: Diagnosis not present

## 2021-07-24 DIAGNOSIS — R2689 Other abnormalities of gait and mobility: Secondary | ICD-10-CM | POA: Diagnosis not present

## 2021-07-27 DIAGNOSIS — M25562 Pain in left knee: Secondary | ICD-10-CM | POA: Diagnosis not present

## 2021-07-29 DIAGNOSIS — M79662 Pain in left lower leg: Secondary | ICD-10-CM | POA: Diagnosis not present

## 2021-07-29 DIAGNOSIS — M25562 Pain in left knee: Secondary | ICD-10-CM | POA: Diagnosis not present

## 2021-07-29 DIAGNOSIS — M25662 Stiffness of left knee, not elsewhere classified: Secondary | ICD-10-CM | POA: Diagnosis not present

## 2021-07-29 DIAGNOSIS — R2689 Other abnormalities of gait and mobility: Secondary | ICD-10-CM | POA: Diagnosis not present

## 2021-08-03 DIAGNOSIS — M25562 Pain in left knee: Secondary | ICD-10-CM | POA: Diagnosis not present

## 2021-08-03 DIAGNOSIS — M79662 Pain in left lower leg: Secondary | ICD-10-CM | POA: Diagnosis not present

## 2021-08-03 DIAGNOSIS — M25662 Stiffness of left knee, not elsewhere classified: Secondary | ICD-10-CM | POA: Diagnosis not present

## 2021-08-03 DIAGNOSIS — R2689 Other abnormalities of gait and mobility: Secondary | ICD-10-CM | POA: Diagnosis not present

## 2021-08-05 DIAGNOSIS — R2689 Other abnormalities of gait and mobility: Secondary | ICD-10-CM | POA: Diagnosis not present

## 2021-08-05 DIAGNOSIS — M25662 Stiffness of left knee, not elsewhere classified: Secondary | ICD-10-CM | POA: Diagnosis not present

## 2021-08-05 DIAGNOSIS — M79662 Pain in left lower leg: Secondary | ICD-10-CM | POA: Diagnosis not present

## 2021-08-05 DIAGNOSIS — M25562 Pain in left knee: Secondary | ICD-10-CM | POA: Diagnosis not present

## 2021-08-10 DIAGNOSIS — M79662 Pain in left lower leg: Secondary | ICD-10-CM | POA: Diagnosis not present

## 2021-08-10 DIAGNOSIS — M25662 Stiffness of left knee, not elsewhere classified: Secondary | ICD-10-CM | POA: Diagnosis not present

## 2021-08-10 DIAGNOSIS — R2689 Other abnormalities of gait and mobility: Secondary | ICD-10-CM | POA: Diagnosis not present

## 2021-08-10 DIAGNOSIS — M25562 Pain in left knee: Secondary | ICD-10-CM | POA: Diagnosis not present

## 2021-08-12 DIAGNOSIS — M25662 Stiffness of left knee, not elsewhere classified: Secondary | ICD-10-CM | POA: Diagnosis not present

## 2021-08-12 DIAGNOSIS — M25562 Pain in left knee: Secondary | ICD-10-CM | POA: Diagnosis not present

## 2021-08-12 DIAGNOSIS — R2689 Other abnormalities of gait and mobility: Secondary | ICD-10-CM | POA: Diagnosis not present

## 2021-08-12 DIAGNOSIS — M79662 Pain in left lower leg: Secondary | ICD-10-CM | POA: Diagnosis not present

## 2021-08-19 DIAGNOSIS — M25662 Stiffness of left knee, not elsewhere classified: Secondary | ICD-10-CM | POA: Diagnosis not present

## 2021-08-19 DIAGNOSIS — M79662 Pain in left lower leg: Secondary | ICD-10-CM | POA: Diagnosis not present

## 2021-08-19 DIAGNOSIS — M25562 Pain in left knee: Secondary | ICD-10-CM | POA: Diagnosis not present

## 2021-08-19 DIAGNOSIS — R2689 Other abnormalities of gait and mobility: Secondary | ICD-10-CM | POA: Diagnosis not present

## 2021-09-09 DIAGNOSIS — E7800 Pure hypercholesterolemia, unspecified: Secondary | ICD-10-CM | POA: Diagnosis not present

## 2021-09-17 DIAGNOSIS — M25562 Pain in left knee: Secondary | ICD-10-CM | POA: Diagnosis not present

## 2021-09-18 ENCOUNTER — Other Ambulatory Visit: Payer: Self-pay | Admitting: Sports Medicine

## 2021-09-18 DIAGNOSIS — M25562 Pain in left knee: Secondary | ICD-10-CM

## 2021-09-25 ENCOUNTER — Ambulatory Visit
Admission: RE | Admit: 2021-09-25 | Discharge: 2021-09-25 | Disposition: A | Discharge status: Home / Self Care | Payer: BC Managed Care – PPO | Source: Ambulatory Visit | Attending: Sports Medicine | Admitting: Sports Medicine

## 2021-09-25 DIAGNOSIS — M25562 Pain in left knee: Secondary | ICD-10-CM

## 2021-10-21 DIAGNOSIS — S83242A Other tear of medial meniscus, current injury, left knee, initial encounter: Secondary | ICD-10-CM | POA: Diagnosis not present

## 2022-02-02 DIAGNOSIS — H2513 Age-related nuclear cataract, bilateral: Secondary | ICD-10-CM | POA: Diagnosis not present

## 2022-02-02 DIAGNOSIS — H5203 Hypermetropia, bilateral: Secondary | ICD-10-CM | POA: Diagnosis not present

## 2022-03-03 DIAGNOSIS — L570 Actinic keratosis: Secondary | ICD-10-CM | POA: Diagnosis not present

## 2022-03-03 DIAGNOSIS — D225 Melanocytic nevi of trunk: Secondary | ICD-10-CM | POA: Diagnosis not present

## 2022-03-03 DIAGNOSIS — L814 Other melanin hyperpigmentation: Secondary | ICD-10-CM | POA: Diagnosis not present

## 2022-03-03 DIAGNOSIS — L821 Other seborrheic keratosis: Secondary | ICD-10-CM | POA: Diagnosis not present

## 2022-03-03 DIAGNOSIS — L905 Scar conditions and fibrosis of skin: Secondary | ICD-10-CM | POA: Diagnosis not present

## 2022-03-17 DIAGNOSIS — Z125 Encounter for screening for malignant neoplasm of prostate: Secondary | ICD-10-CM | POA: Diagnosis not present

## 2022-03-17 DIAGNOSIS — E7800 Pure hypercholesterolemia, unspecified: Secondary | ICD-10-CM | POA: Diagnosis not present

## 2022-03-17 DIAGNOSIS — Z0000 Encounter for general adult medical examination without abnormal findings: Secondary | ICD-10-CM | POA: Diagnosis not present

## 2022-09-01 DIAGNOSIS — D225 Melanocytic nevi of trunk: Secondary | ICD-10-CM | POA: Diagnosis not present

## 2022-09-01 DIAGNOSIS — L570 Actinic keratosis: Secondary | ICD-10-CM | POA: Diagnosis not present

## 2022-09-01 DIAGNOSIS — L814 Other melanin hyperpigmentation: Secondary | ICD-10-CM | POA: Diagnosis not present

## 2022-09-01 DIAGNOSIS — L821 Other seborrheic keratosis: Secondary | ICD-10-CM | POA: Diagnosis not present

## 2022-12-17 DIAGNOSIS — Z23 Encounter for immunization: Secondary | ICD-10-CM | POA: Diagnosis not present

## 2023-02-09 DIAGNOSIS — H5203 Hypermetropia, bilateral: Secondary | ICD-10-CM | POA: Diagnosis not present

## 2023-02-09 DIAGNOSIS — H2513 Age-related nuclear cataract, bilateral: Secondary | ICD-10-CM | POA: Diagnosis not present

## 2023-03-09 DIAGNOSIS — L570 Actinic keratosis: Secondary | ICD-10-CM | POA: Diagnosis not present

## 2023-03-09 DIAGNOSIS — L821 Other seborrheic keratosis: Secondary | ICD-10-CM | POA: Diagnosis not present

## 2023-03-09 DIAGNOSIS — L814 Other melanin hyperpigmentation: Secondary | ICD-10-CM | POA: Diagnosis not present

## 2023-03-09 DIAGNOSIS — D225 Melanocytic nevi of trunk: Secondary | ICD-10-CM | POA: Diagnosis not present

## 2023-03-17 DIAGNOSIS — Z130 Encounter for screening for diseases of the blood and blood-forming organs and certain disorders involving the immune mechanism: Secondary | ICD-10-CM | POA: Diagnosis not present

## 2023-03-17 DIAGNOSIS — E7800 Pure hypercholesterolemia, unspecified: Secondary | ICD-10-CM | POA: Diagnosis not present

## 2023-03-17 DIAGNOSIS — Z125 Encounter for screening for malignant neoplasm of prostate: Secondary | ICD-10-CM | POA: Diagnosis not present

## 2023-03-17 DIAGNOSIS — Z1159 Encounter for screening for other viral diseases: Secondary | ICD-10-CM | POA: Diagnosis not present

## 2023-03-21 ENCOUNTER — Other Ambulatory Visit (HOSPITAL_BASED_OUTPATIENT_CLINIC_OR_DEPARTMENT_OTHER): Payer: Self-pay | Admitting: Family Medicine

## 2023-03-21 DIAGNOSIS — Z0189 Encounter for other specified special examinations: Secondary | ICD-10-CM | POA: Diagnosis not present

## 2023-03-21 DIAGNOSIS — E7800 Pure hypercholesterolemia, unspecified: Secondary | ICD-10-CM | POA: Diagnosis not present

## 2023-05-11 ENCOUNTER — Encounter (HOSPITAL_BASED_OUTPATIENT_CLINIC_OR_DEPARTMENT_OTHER): Payer: Self-pay

## 2023-05-11 ENCOUNTER — Ambulatory Visit (HOSPITAL_BASED_OUTPATIENT_CLINIC_OR_DEPARTMENT_OTHER): Payer: BLUE CROSS/BLUE SHIELD

## 2023-05-16 ENCOUNTER — Ambulatory Visit (HOSPITAL_BASED_OUTPATIENT_CLINIC_OR_DEPARTMENT_OTHER)
Admission: RE | Admit: 2023-05-16 | Discharge: 2023-05-16 | Disposition: A | Discharge status: Home / Self Care | Payer: Self-pay | Source: Ambulatory Visit | Attending: Family Medicine | Admitting: Family Medicine

## 2023-05-16 DIAGNOSIS — E7800 Pure hypercholesterolemia, unspecified: Secondary | ICD-10-CM | POA: Insufficient documentation

## 2023-08-26 DIAGNOSIS — H10503 Unspecified blepharoconjunctivitis, bilateral: Secondary | ICD-10-CM | POA: Diagnosis not present

## 2023-08-26 DIAGNOSIS — H15101 Unspecified episcleritis, right eye: Secondary | ICD-10-CM | POA: Diagnosis not present

## 2023-09-08 DIAGNOSIS — L821 Other seborrheic keratosis: Secondary | ICD-10-CM | POA: Diagnosis not present

## 2023-09-08 DIAGNOSIS — L570 Actinic keratosis: Secondary | ICD-10-CM | POA: Diagnosis not present

## 2023-09-08 DIAGNOSIS — D225 Melanocytic nevi of trunk: Secondary | ICD-10-CM | POA: Diagnosis not present

## 2023-09-08 DIAGNOSIS — L814 Other melanin hyperpigmentation: Secondary | ICD-10-CM | POA: Diagnosis not present

## 2023-09-08 DIAGNOSIS — Z872 Personal history of diseases of the skin and subcutaneous tissue: Secondary | ICD-10-CM | POA: Diagnosis not present

## 2024-02-09 IMAGING — MR MR KNEE*L* W/O CM
5 of 7 series · 23 of 40 positions shown · non-contrast
Comparison: Radiographs dated July 01, 2021

CLINICAL DATA: Left knee pain for 4 months.

EXAM:
MRI OF THE LEFT KNEE WITHOUT CONTRAST
TECHNIQUE: Multiplanar, multisequence MR imaging of the left was performed. No
intravenous contrast was administered.

[Series 3: T2 fat-sat · axial · 4.0mm · 0.50mm/px · z∈[-51,+64]mm · 5 of 24 slices shown (1 of 2)]
[im 1/24]
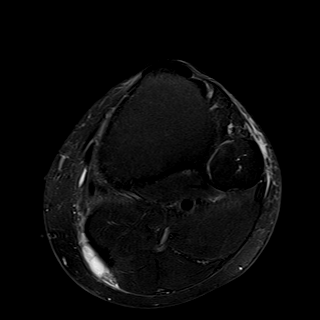
[im 6/24]
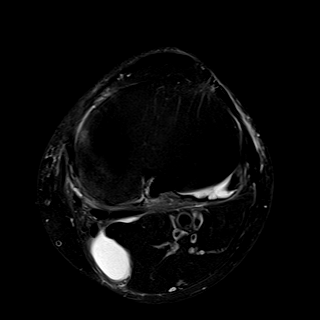
[im 12/24]
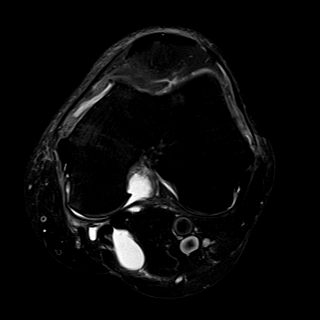
[im 18/24]
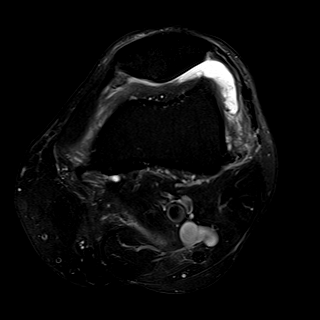
[im 24/24]
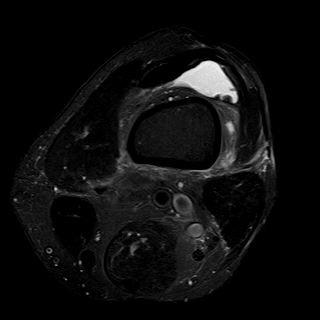

[Series 5: T2 fat-sat · coronal · 4.0mm · 0.39mm/px · 2 of 24 slices shown (2 of 2)]
[im 1/24]
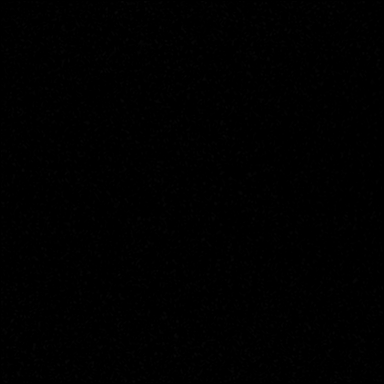
[im 5/24]
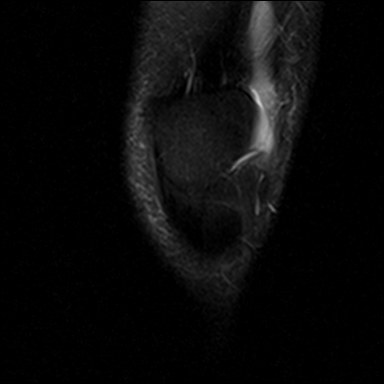

[Series 7: PD fat-sat · sagittal · 3.0mm · 0.29mm/px · 7 of 27 slices shown (1 of 3)]
[im 1/27]
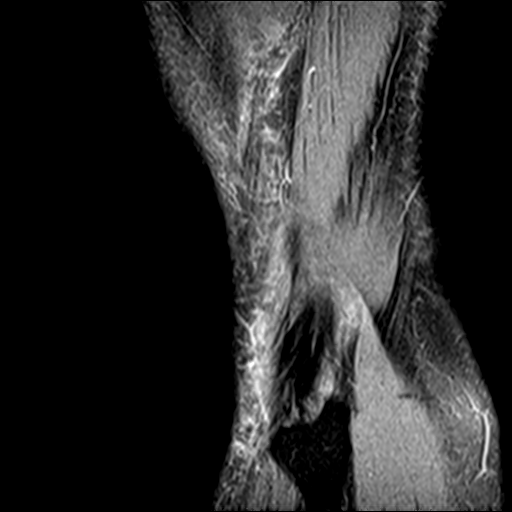
[im 5/27]
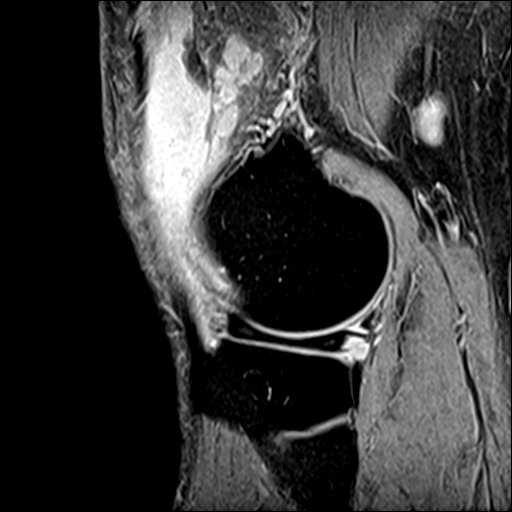
[im 9/27]
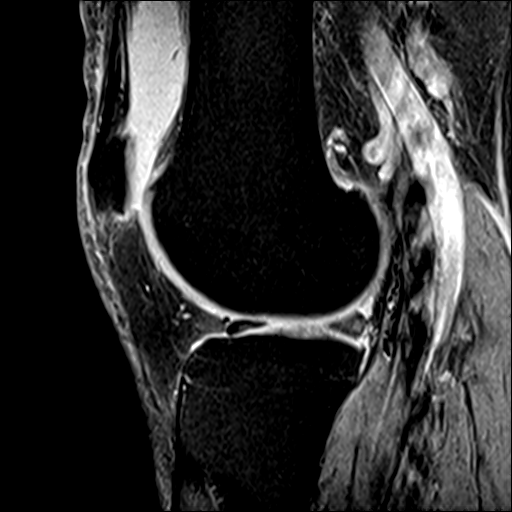
[im 14/27]
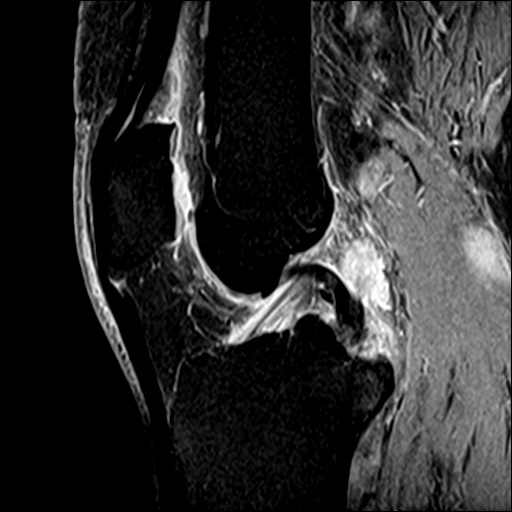
[im 18/27]
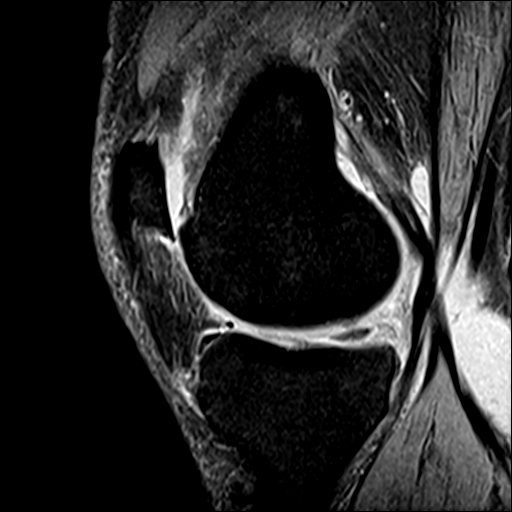
[im 22/27]
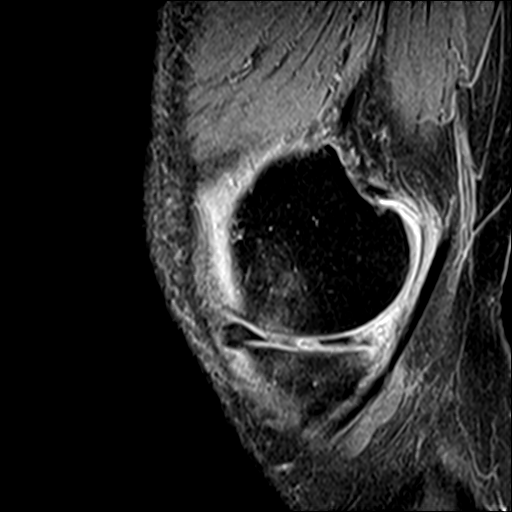
[im 27/27]
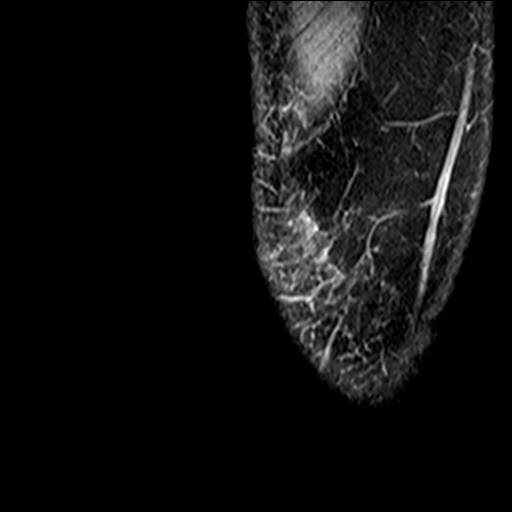

[Series 8: PD fat-sat · coronal · 4.0mm · 0.29mm/px · 6 of 24 slices shown (2 of 3)]
[im 1/24]
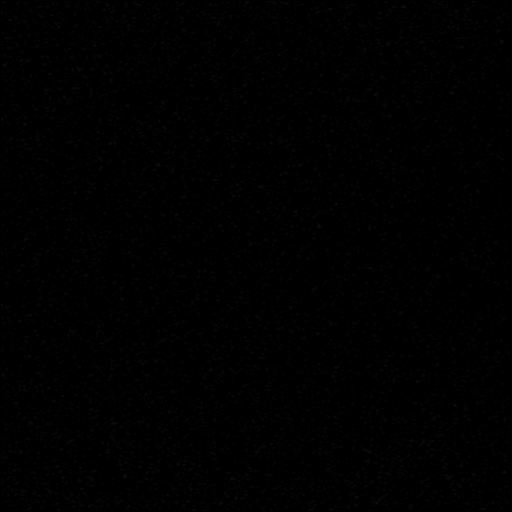
[im 5/24]
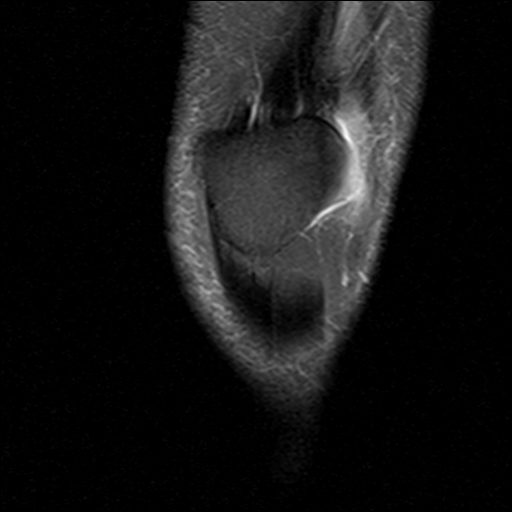
[im 10/24]
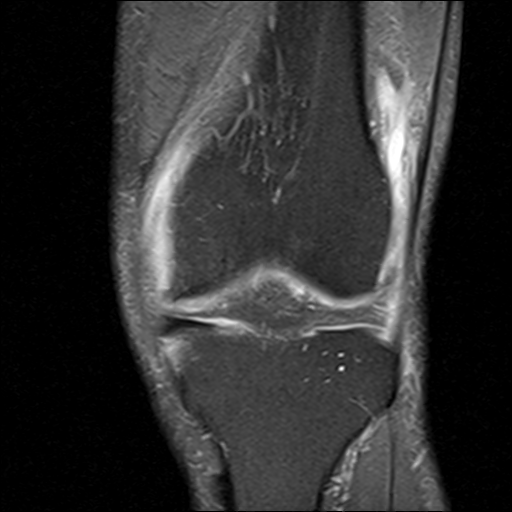
[im 14/24]
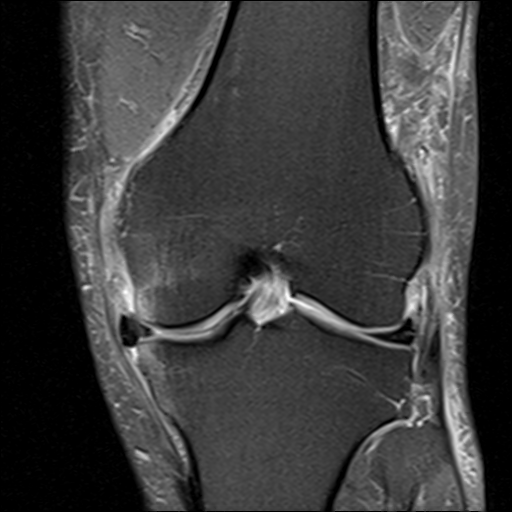
[im 19/24]
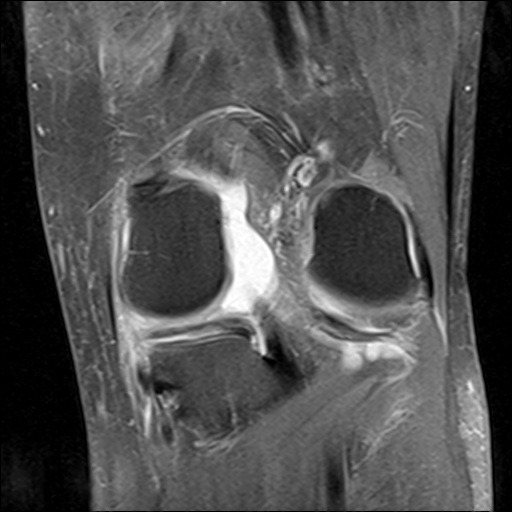
[im 24/24]
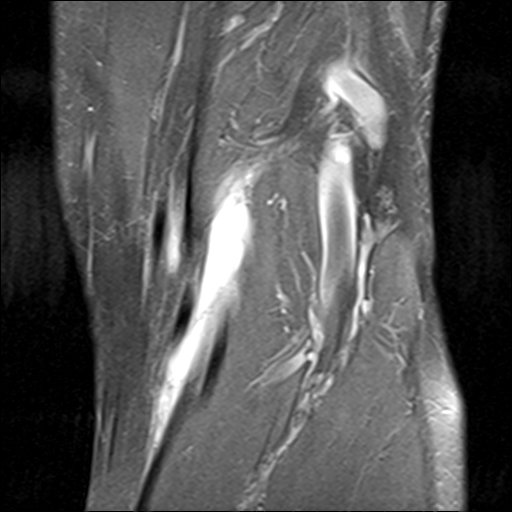

[Series 9: PD fat-sat · oblique · 2.0mm · 0.29mm/px · 3 of 11 slices shown (3 of 3)]
[im 1/11]
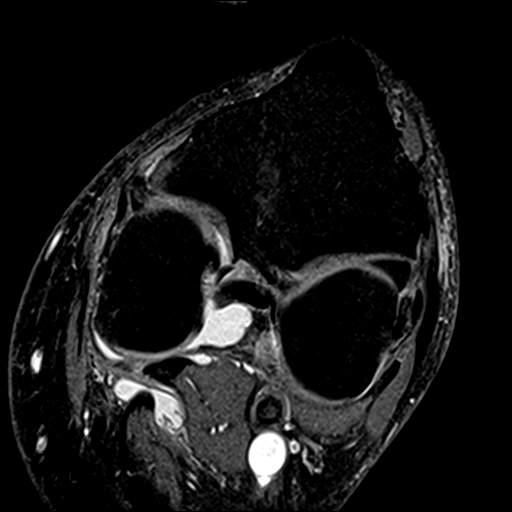
[im 6/11]
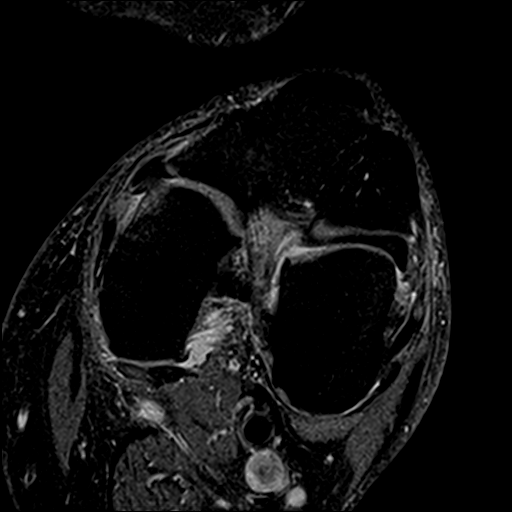
[im 11/11]
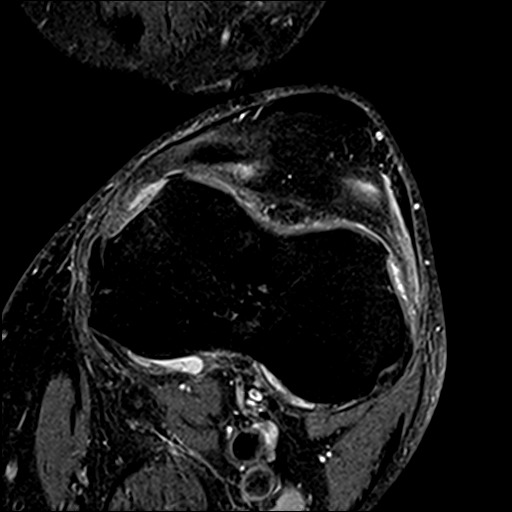

[23 of 40 positions shown; findings below may reference images not displayed]

FINDINGS: MENISCI

Medial: Complex tear of the posterior horn/body of the medial
meniscus.

Lateral: Intact.

LIGAMENTS

Cruciates: ACL and PCL are intact.

Collaterals: Medial collateral ligament is intact. Lateral
collateral ligament complex is intact.

CARTILAGE

Patellofemoral: Full-thickness cartilage defect at the patellar
apex. Articular cartilage thinning of the trochlea.

Medial: Full-thickness cartilage loss with subchondral edema of the
medial tibial plateau and medial femoral condyle.

Lateral:  No chondral defect.

JOINT: Moderate joint effusion. Normal Suaidi Thom. Synovial
thickening concerning for synovitis.

POPLITEAL FOSSA: Popliteus tendon is intact. Large Baker's cyst
measuring at least 3.1 x 1.5 x 7.2 cm.

EXTENSOR MECHANISM: Intact quadriceps tendon. Intact patellar
tendon. Intact lateral patellar retinaculum. Intact medial patellar
retinaculum. Intact MPFL.

BONES: No aggressive osseous lesion. No fracture or dislocation.

Other: No fluid collection or hematoma. Muscles are normal.
IMPRESSION: 1.  Complex tear of the body/posterior horn of the medial meniscus.

2.  Moderate joint effusion with synovitis.

3. Mild-to-moderate patellofemoral and medial tibiofemoral
osteoarthritis.

4.  Large Baker's cyst measuring approximately 3.1 x 1.5 x 7.2 cm.

## 2024-02-14 DIAGNOSIS — H5203 Hypermetropia, bilateral: Secondary | ICD-10-CM | POA: Diagnosis not present

## 2024-02-14 DIAGNOSIS — H2513 Age-related nuclear cataract, bilateral: Secondary | ICD-10-CM | POA: Diagnosis not present

## 2024-02-14 DIAGNOSIS — H5010 Unspecified exotropia: Secondary | ICD-10-CM | POA: Diagnosis not present

## 2024-03-12 DIAGNOSIS — L821 Other seborrheic keratosis: Secondary | ICD-10-CM | POA: Diagnosis not present

## 2024-03-12 DIAGNOSIS — Z872 Personal history of diseases of the skin and subcutaneous tissue: Secondary | ICD-10-CM | POA: Diagnosis not present

## 2024-03-12 DIAGNOSIS — L814 Other melanin hyperpigmentation: Secondary | ICD-10-CM | POA: Diagnosis not present

## 2024-03-12 DIAGNOSIS — L570 Actinic keratosis: Secondary | ICD-10-CM | POA: Diagnosis not present

## 2024-03-12 DIAGNOSIS — D1801 Hemangioma of skin and subcutaneous tissue: Secondary | ICD-10-CM | POA: Diagnosis not present
